# Patient Record
Sex: Female | Born: 1965 | Hispanic: Yes | Marital: Single | Smoking: Never smoker
Health system: Southern US, Community
[De-identification: ages and names within clinical notes are randomized; demographics above are authoritative.]

## PROBLEM LIST (undated history)

## (undated) DIAGNOSIS — E669 Obesity, unspecified: Secondary | ICD-10-CM

## (undated) DIAGNOSIS — I10 Essential (primary) hypertension: Secondary | ICD-10-CM

## (undated) HISTORY — PX: TONSILLECTOMY: SUR1361

## (undated) HISTORY — DX: Obesity, unspecified: E66.9

---

## 2005-11-13 ENCOUNTER — Ambulatory Visit: Payer: Self-pay | Admitting: Family Medicine

## 2005-11-14 ENCOUNTER — Ambulatory Visit (HOSPITAL_COMMUNITY): Admission: RE | Admit: 2005-11-14 | Discharge: 2005-11-14 | Payer: Self-pay | Admitting: Sports Medicine

## 2014-09-29 ENCOUNTER — Emergency Department (HOSPITAL_COMMUNITY): Payer: Medicaid Other

## 2014-09-29 ENCOUNTER — Emergency Department (HOSPITAL_COMMUNITY)
Admission: EM | Admit: 2014-09-29 | Discharge: 2014-09-29 | Disposition: A | Discharge status: Home / Self Care | Payer: Medicaid Other | Attending: Emergency Medicine | Admitting: Emergency Medicine

## 2014-09-29 ENCOUNTER — Encounter (HOSPITAL_COMMUNITY): Payer: Self-pay

## 2014-09-29 DIAGNOSIS — R112 Nausea with vomiting, unspecified: Secondary | ICD-10-CM | POA: Diagnosis not present

## 2014-09-29 DIAGNOSIS — M545 Low back pain: Secondary | ICD-10-CM | POA: Diagnosis not present

## 2014-09-29 DIAGNOSIS — R42 Dizziness and giddiness: Secondary | ICD-10-CM | POA: Insufficient documentation

## 2014-09-29 DIAGNOSIS — Z3202 Encounter for pregnancy test, result negative: Secondary | ICD-10-CM | POA: Insufficient documentation

## 2014-09-29 DIAGNOSIS — R51 Headache: Secondary | ICD-10-CM | POA: Insufficient documentation

## 2014-09-29 LAB — URINALYSIS, ROUTINE W REFLEX MICROSCOPIC
BILIRUBIN URINE: NEGATIVE
Glucose, UA: NEGATIVE mg/dL
Hgb urine dipstick: NEGATIVE
KETONES UR: NEGATIVE mg/dL
NITRITE: NEGATIVE
PROTEIN: NEGATIVE mg/dL
Specific Gravity, Urine: 1.023 (ref 1.005–1.030)
UROBILINOGEN UA: 0.2 mg/dL (ref 0.0–1.0)
pH: 5.5 (ref 5.0–8.0)

## 2014-09-29 LAB — CBC
HEMATOCRIT: 43 % (ref 36.0–46.0)
Hemoglobin: 13.9 g/dL (ref 12.0–15.0)
MCH: 29.4 pg (ref 26.0–34.0)
MCHC: 32.3 g/dL (ref 30.0–36.0)
MCV: 91.1 fL (ref 78.0–100.0)
Platelets: 264 10*3/uL (ref 150–400)
RBC: 4.72 MIL/uL (ref 3.87–5.11)
RDW: 12.4 % (ref 11.5–15.5)
WBC: 7.3 10*3/uL (ref 4.0–10.5)

## 2014-09-29 LAB — BASIC METABOLIC PANEL
Anion gap: 8 (ref 5–15)
BUN: 22 mg/dL (ref 6–23)
CHLORIDE: 104 meq/L (ref 96–112)
CO2: 31 mmol/L (ref 19–32)
Calcium: 9.4 mg/dL (ref 8.4–10.5)
Creatinine, Ser: 0.7 mg/dL (ref 0.50–1.10)
GLUCOSE: 104 mg/dL — AB (ref 70–99)
POTASSIUM: 4 mmol/L (ref 3.5–5.1)
SODIUM: 143 mmol/L (ref 135–145)

## 2014-09-29 LAB — URINE MICROSCOPIC-ADD ON

## 2014-09-29 LAB — I-STAT TROPONIN, ED: Troponin i, poc: 0 ng/mL (ref 0.00–0.08)

## 2014-09-29 LAB — POC URINE PREG, ED: Preg Test, Ur: NEGATIVE

## 2014-09-29 MED ORDER — MECLIZINE HCL 25 MG PO TABS: 25.0000 mg | ORAL_TABLET | Freq: Two times a day (BID) | ORAL | Status: DC | PRN

## 2014-09-29 MED ORDER — MECLIZINE HCL 25 MG PO TABS
25.0000 mg | ORAL_TABLET | Freq: Once | ORAL | Status: AC
  Administered 2014-09-29: 25 mg via ORAL
  Filled 2014-09-29: qty 1
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
## 2014-09-29 NOTE — Discharge Instructions (Signed)
   Vrtigo (Vertigo)  Vrtigo es la sensacin de que se est moviendo estando quieto. Puede ser peligroso si ocurre cuando est trabajado, conduciendo vehculos o realizando actividades difciles.  CAUSAS  El vrtigo se produce cuando hay un conflicto en las seales que se envan al cerebro desde los sistemas visual y sensorial del cuerpo. Hay numerosas causas que originan este problema, entre las que se incluyen:   Infecciones, especialmente en el odo interno.  Una mala reaccin a un medicamento o mal uso de alcohol y frmacos.  Abstinencia de drogas o alcohol.  Cambios rpidos de posicin, como al acostarse o darse vuelta en la cama.  Dolor de cabeza migraoso.  Disminucin del flujo sanguneo hacia el cerebro.  Aumento de la presin en el cerebro por un traumatismo, infeccin, tumor o sangrado en la cabeza. SNTOMAS  Puede sentir como si el mundo da vueltas o va a caer al piso. Como hay problemas en el equilibrio, el vrtigo puede causar nuseas y vmitos. Tiene movimientos oculares involuntarios (nistagmus).  DIAGNSTICO  El vrtigo normalmente se diagnostica con un examen fsico. Si la causa no se conoce, el mdico puede indicar diagnstico por imgenes, como una resonancia magntica (imgenes por resonancia magntica).  TRATAMIENTO  La mayor parte de los casos de vrtigo se resuelve sin tratamiento. Segn la causa, el mdico podr recetar ciertos medicamentos. Si se relaciona con la posicin del cuerpo, podr recomendarle movimientos o procedimientos para corregir el problema. En algunos casos raros, si la causa del vrtigo es un problema en el odo interno, necesitar una ciruga.  INSTRUCCIONES PARA EL CUIDADO DOMICILIARIO  Siga las indicaciones del mdico.  Evite conducir vehculos.  Evite operar maquinarias pesadas.  Evite realizar tareas que seran peligrosas para usted u otras personas durante un episodio de vrtigo.  Comunquele al mdico si nota que ciertos  medicamentos parecen asociarse con las crisis. Algunos medicamentos que se usan para tratar los episodios, en algunas personas los empeoran. SOLICITE ATENCIN MDICA DE INMEDIATO SI:  Los medicamentos no alivian las crisis o hacen que estas empeoren.  Tiene dificultad para hablar, caminar, siente debilidad o tiene problemas para usar los brazos, las manos o las piernas.  Comienza a sufrir un dolor de cabeza intenso.  Las nuseas y los vmitos no se alivian o se agravan.  Aparecen trastornos visuales.  Un miembro de su familia nota cambios en su conducta.  Hay alguna modificacin en su trastorno que parece hacerlo empeorar en lugar de mejorar. ASEGRESE DE QUE:   Comprende estas instrucciones.  Controlar su enfermedad.  Solicitar ayuda de inmediato si no mejora o si empeora. Document Released: 06/21/2005 Document Revised: 12/04/2011 ExitCare Patient Information 2015 [employer]. This information is not intended to replace advice given to you by your health care provider. Make sure you discuss any questions you have with your health care provider.  

## 2014-09-29 NOTE — Progress Notes (Signed)
[facility] Specialist  [facility] @ 10:45am, pt verbalized understanding of this appointment. My contact information provided for any future questions or concerns. Interpreter phones used.

## 2014-09-29 NOTE — ED Notes (Signed)
Pt returned.
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
## 2014-10-05 ENCOUNTER — Encounter: Payer: Self-pay | Admitting: Neurology

## 2014-10-05 ENCOUNTER — Ambulatory Visit (INDEPENDENT_AMBULATORY_CARE_PROVIDER_SITE_OTHER): Payer: Medicaid Other | Admitting: Neurology

## 2014-10-05 VITALS — BP 130/91 | HR 67 | Ht 60.0 in | Wt 143.2 lb

## 2014-10-05 DIAGNOSIS — S161XXA Strain of muscle, fascia and tendon at neck level, initial encounter: Secondary | ICD-10-CM | POA: Insufficient documentation

## 2014-10-05 DIAGNOSIS — G4486 Cervicogenic headache: Secondary | ICD-10-CM | POA: Insufficient documentation

## 2014-10-05 DIAGNOSIS — R51 Headache: Secondary | ICD-10-CM

## 2014-10-05 MED ORDER — METHOCARBAMOL 500 MG PO TABS: 500.0000 mg | ORAL_TABLET | Freq: Three times a day (TID) | ORAL | Status: DC
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
## 2014-10-05 NOTE — Patient Instructions (Signed)
Distensión cervical °(Cervical Sprain) °Una distensión cervical es una lesión en el cuello, en la que los tejidos fuertes y fibrosos (ligamentos) que unen los huesos del cuello, se distienden o se rompen. Una distensión cervical puede ser desde muy leve a muy grave. En los casos graves pueden hacer que las vértebras del cuello se vuelvan inestables. Esto puede causar un daño en la médula espinal y puede dar lugar a graves problemas del sistema nervioso. La cantidad de tiempo que demora la mejoría de una distensión cervical depende de la causa y de la extensión de la lesión. La mayoría de las veces se cura en 1 a 3 semanas. °CAUSAS  °Las distensiones graves pueden ser causadas por:  °· Lesiones por deportes de contacto (como en el fútbol americano, rugby, lucha, hockey, automovilismo, gimnasia, buceo, artes marciales y boxeo). °· Colisiones en vehículos de motor. °· Lesiones de latigazo cervical. Esta es una lesión por movimiento brusco de adelante hacia atrás de la cabeza y el cuello. °· Caídas. °La causa de las distensiones cervicales leves pueden ser:  °· Adoptar posiciones incómodas, como sostener el teléfono entre la oreja y el hombro. °· Sentarse en una silla que no ofrece el soporte adecuado. °· Trabajar en una mesa de computadora mal diseñada. °· Las actividades que requieren mirar hacia arriba o hacia abajo durante largos períodos. °SÍNTOMAS  °· Dolor, sensibilidad, rigidez, o sensación de ardor en la parte anterior, posterior o lateral del cuello. Este malestar puede aparecer inmediatamente después de la lesión o puede desarrollarse lentamente y no empezar hasta 24 horas o más después de la lesión. °· Dolor o sensibilidad que se siente directamente en la parte media posterior del cuello. °· Dolor en el hombro o la zona superior de la espalda. °· Capacidad limitada para mover el cuello. °· Dolor de cabeza. °· Mareos. °· Debilidad, entumecimiento u hormigueo en las manos o los brazos. °· Espasmos  musculares. °· Dificultades para tragar o comer. °· Sensibilidad e hinchazón en el cuello. °DIAGNÓSTICO  °La mayoría de las veces, el médico puede diagnosticar este problema mediante la historia clínica y un examen físico. Su médico le preguntará acerca de lesiones previas y problemas conocidos como artritis en el cuello. Podrán tomarle radiografías para determinar si hay otros problemas, como enfermedades en los huesos del cuello. También puede ser necesario realizar otras pruebas, como tomografías computadas o resonancia magnética.  °TRATAMIENTO  °El tratamiento depende de la gravedad de la distensión. Las distensiones leves se pueden tratar con reposo, manteniendo el cuello en su lugar (inmovilización) y usando medicamentos para el dolor. Las distensiones graves deben ser inmediatamente inmovilizadas. Será necesario completar el tratamiento para aliviar el dolor, los espasmos musculares y otros síntomas, y puede incluir. °· Medicamentos como calmantes para el dolor, anestésicos o relajantes musculares. °· Fisioterapia. Esto puede incluir ejercicios de elongación, fortalecimiento y entrenamiento de la postura. Los ejercicios y una mejor postura pueden ayudar a estabilizar el cuello, fortalecer los músculos y evitar que los síntomas vuelvan a aparecer. °INSTRUCCIONES PARA EL CUIDADO EN EL HOGAR  °· Aplique hielo sobre la zona lesionada. °¨ Ponga el hielo en una bolsa plástica. °¨ Colóquese una toalla entre la piel y la bolsa de hielo. °¨ Deje el hielo durante 15 - 20 minutos y aplíquelo 3 - 4 veces por día. °· Si la lesión fue grave, le indicarán el uso de un collarín cervical. El collarín cervical es un collar de dos piezas para impedir que el cuello se mueva mientras se cura. °¨   Nose quite el collarín excepto que se lo indique su médico. °¨ Si tiene el cabello largo, manténgalo fuera del collarín. °¨ Consulte a su médico antes de hacerle ajustes. Los ajustes menores pueden ser requeridos con el tiempo para  mejorar el confort y reducir la presión sobre la barbilla o en la parte posterior de la cabeza. °¨ Si le permiten quitarse el collarín para lavarlo o darse un baño, siga las indicaciones de su médico acerca de cómo hacerlo con seguridad. °¨ Mantenga el collarín limpio pasando un paño con agua y jabón y secándolo bien. Si el collarín tiene almohadillas removibles, quítelas cada 1-2 días para lavarlas a mano con agua y jabón. Deje que se sequen al aire. Debe secarlas bien antes de volver a colocarlas en el collarín. °¨ Si le permiten quitarse el collarín para lavarlo y darse un baño, lave y seque la piel del cuello. Controle su piel para detectar irritación o llagas. Si las tiene, informe a su médico. °¨ No conduzca vehículos mientras usa el collarín. °· Sólo tome medicamentos de venta libre o recetados para calmar el dolor, el malestar o bajar la fiebre, según las indicaciones de su médico. °· Cumpla con todas las visitas de control, según le indique su médico. °· Cumpla con todas las sesiones de fisioterapia, según le indique su médico. °· Haga los ajustes necesarios en su lugar de trabajo para favorecer una buena postura. °· Evite las posiciones y actividades que empeoran los síntomas. °· Haga precalentamiento y elongue antes de comenzar una actividad para evitar problemas. °SOLICITE ATENCIÓN MÉDICA SI:  °· El dolor no se alivia con los medicamentos. °· No puede disminuir la dosis de analgésicos según lo planificado. °· Su nivel de actividad no mejora según lo esperado. °SOLICITE ATENCIÓN MÉDICA DE INMEDIATO SI:  °· Presenta cualquier hemorragia. °· Siente malestar estomacal. °· Tiene signos de reacción alérgica a los medicamentos. °· Los síntomas empeoran. °· Le aparecen síntomas nuevos e inexplicables. °· Siente adormecimiento, hormigueo, debilidad o parálisis en alguna parte del cuerpo. °ASEGÚRESE DE QUE:  °· Comprende estas instrucciones. °· Controlará su afección. °· Recibirá ayuda de inmediato si no mejora o  si empeora. °Document Released: 12/08/2008 Document Revised: 07/02/2013 °ExitCare® Patient Information ©2015 [employer]. This information is not intended to replace advice given to you by your health care provider. Make sure you discuss any questions you have with your health care provider. ° °

## 2014-11-16 ENCOUNTER — Ambulatory Visit (INDEPENDENT_AMBULATORY_CARE_PROVIDER_SITE_OTHER): Payer: Medicaid Other | Admitting: Adult Health

## 2014-11-16 ENCOUNTER — Encounter: Payer: Self-pay | Admitting: Adult Health

## 2014-11-16 VITALS — BP 125/89 | HR 78 | Ht 60.0 in | Wt 144.6 lb

## 2014-11-16 DIAGNOSIS — M542 Cervicalgia: Secondary | ICD-10-CM

## 2014-11-16 DIAGNOSIS — R51 Headache: Secondary | ICD-10-CM

## 2014-11-16 DIAGNOSIS — G4486 Cervicogenic headache: Secondary | ICD-10-CM

## 2014-11-16 DIAGNOSIS — R200 Anesthesia of skin: Secondary | ICD-10-CM

## 2014-11-16 NOTE — Progress Notes (Signed)
I reviewed note and agree with plan.   Suanne MarkerVIKRAM R. PENUMALLI, MD 11/16/2014, 4:52 PM Certified in Neurology, Neurophysiology and Neuroimaging  [facility] [address], [address] [place], [place] [id number] [phone]
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
## 2015-01-12 ENCOUNTER — Encounter: Payer: Self-pay | Admitting: Adult Health

## 2015-01-15 ENCOUNTER — Ambulatory Visit: Payer: Self-pay | Admitting: Adult Health

## 2015-01-15 ENCOUNTER — Ambulatory Visit: Payer: Medicaid Other | Admitting: Adult Health

## 2015-02-25 ENCOUNTER — Ambulatory Visit: Payer: Medicaid Other | Attending: Neurology | Admitting: Rehabilitative and Restorative Service Providers"

## 2015-02-25 DIAGNOSIS — S161XXS Strain of muscle, fascia and tendon at neck level, sequela: Secondary | ICD-10-CM | POA: Insufficient documentation

## 2015-02-25 DIAGNOSIS — X58XXXS Exposure to other specified factors, sequela: Secondary | ICD-10-CM | POA: Insufficient documentation

## 2015-02-25 DIAGNOSIS — R42 Dizziness and giddiness: Secondary | ICD-10-CM | POA: Insufficient documentation

## 2015-02-25 DIAGNOSIS — S060X0S Concussion without loss of consciousness, sequela: Secondary | ICD-10-CM

## 2015-02-25 DIAGNOSIS — R51 Headache: Secondary | ICD-10-CM | POA: Insufficient documentation

## 2015-02-25 DIAGNOSIS — S161XXD Strain of muscle, fascia and tendon at neck level, subsequent encounter: Secondary | ICD-10-CM

## 2015-02-25 DIAGNOSIS — G4486 Cervicogenic headache: Secondary | ICD-10-CM
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
## 2015-02-25 NOTE — Patient Instructions (Addendum)
   Thoracic Self-Mobilization (Supine)   Con una toalla enrollada paralela a la columna en a nivel costillas inferiores, acustese sobre la toalla con los brazos extendidos a los lados. Sostenga __2__ minutos. Descanse. Repita __1__ veces por rutina. Realice __1__ Carlis Stablerutinas por sesin. Realice __2  sesiones por da.  http://orth.exer.us/1001   Copyright  VHI. All rights reserved.    Flexors, Supine   Lie on back, head on small, rolled towel. Nod head, tipping chin down.  Hold _2 minutos. Repeat _1__ times per session. Do _2__ sessions per day.  Copyright  VHI. All rights reserved.  Sit to Side-Lying   Sit on edge of bed. Look to the right and lie down onto left side. Hold until dizziness stops + 20 seconds.  Return to sitting.  Repeat to the other side.  Repeat sequence _5___ times per session. Do __2__ sessions per day.  Copyright  VHI. All rights reserved.  Rolling   With pillow under head, start on back. Roll slowly to right. Hold position until symptoms subside. Roll slowly onto left side. Hold position until symptoms subside. Repeat sequence __5__ times per session. Do _2___ sessions per day.  Copyright  VHI. All rights reserved.

## 2015-03-03 ENCOUNTER — Other Ambulatory Visit: Payer: Self-pay | Admitting: Neurology

## 2015-03-03 DIAGNOSIS — S060X0S Concussion without loss of consciousness, sequela: Secondary | ICD-10-CM

## 2015-04-13 ENCOUNTER — Telehealth: Payer: Self-pay | Admitting: Rehabilitative and Restorative Service Providers"

## 2015-04-13 NOTE — Telephone Encounter (Signed)
PT left message with patient home # with request to call back to clinic.  Purpose of call is to let patient know that medicaid denied request for PT visits and to determine if she wishes to continue with therapy or not attend.

## 2015-04-23 ENCOUNTER — Encounter: Payer: Self-pay | Admitting: Rehabilitative and Restorative Service Providers"
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
## 2015-05-14 ENCOUNTER — Ambulatory Visit: Payer: Medicaid Other | Admitting: Adult Health

## 2015-05-20 ENCOUNTER — Ambulatory Visit (INDEPENDENT_AMBULATORY_CARE_PROVIDER_SITE_OTHER): Payer: Self-pay | Admitting: Adult Health

## 2015-05-20 ENCOUNTER — Encounter: Payer: Self-pay | Admitting: Adult Health

## 2015-05-20 VITALS — BP 121/82 | HR 69 | Ht 60.0 in | Wt 149.0 lb

## 2015-05-20 DIAGNOSIS — M542 Cervicalgia: Secondary | ICD-10-CM

## 2015-05-20 DIAGNOSIS — R42 Dizziness and giddiness: Secondary | ICD-10-CM
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
## 2015-05-20 NOTE — Patient Instructions (Signed)
Glad that you are doing so well!  If your symptoms return please let us know.

## 2015-05-29 NOTE — Progress Notes (Signed)
I agree with the assessment and plan. Breandan People A. Tailey Top, MD, PhD Certified in Neurology, Clinical Neurophysiology, Sleep Medicine, Pain Medicine and Neuroimaging  [facility] [address], [address] [place], [place] [address] [phone]   

## 2015-06-22 DIAGNOSIS — Z0289 Encounter for other administrative examinations: Secondary | ICD-10-CM

## 2015-10-31 ENCOUNTER — Emergency Department (HOSPITAL_COMMUNITY): Payer: Medicaid Other

## 2015-10-31 ENCOUNTER — Emergency Department (HOSPITAL_COMMUNITY)
Admission: EM | Admit: 2015-10-31 | Discharge: 2015-10-31 | Disposition: A | Discharge status: Home / Self Care | Payer: Medicaid Other | Attending: Emergency Medicine | Admitting: Emergency Medicine

## 2015-10-31 ENCOUNTER — Encounter (HOSPITAL_COMMUNITY): Payer: Self-pay | Admitting: Nurse Practitioner

## 2015-10-31 DIAGNOSIS — Z3202 Encounter for pregnancy test, result negative: Secondary | ICD-10-CM | POA: Insufficient documentation

## 2015-10-31 DIAGNOSIS — R091 Pleurisy: Secondary | ICD-10-CM | POA: Insufficient documentation

## 2015-10-31 DIAGNOSIS — Z79899 Other long term (current) drug therapy: Secondary | ICD-10-CM | POA: Insufficient documentation

## 2015-10-31 DIAGNOSIS — E669 Obesity, unspecified: Secondary | ICD-10-CM | POA: Insufficient documentation

## 2015-10-31 DIAGNOSIS — R05 Cough: Secondary | ICD-10-CM | POA: Insufficient documentation

## 2015-10-31 DIAGNOSIS — I10 Essential (primary) hypertension: Secondary | ICD-10-CM | POA: Insufficient documentation

## 2015-10-31 DIAGNOSIS — R1013 Epigastric pain: Secondary | ICD-10-CM | POA: Insufficient documentation

## 2015-10-31 HISTORY — DX: Essential (primary) hypertension: I10

## 2015-10-31 LAB — BASIC METABOLIC PANEL
Anion gap: 10 (ref 5–15)
BUN: 20 mg/dL (ref 6–20)
CO2: 26 mmol/L (ref 22–32)
CREATININE: 0.8 mg/dL (ref 0.44–1.00)
Calcium: 9.7 mg/dL (ref 8.9–10.3)
Chloride: 104 mmol/L (ref 101–111)
GFR calc Af Amer: >60 mL/min (ref >60)
GLUCOSE: 102 mg/dL — AB (ref 65–99)
POTASSIUM: 4.2 mmol/L (ref 3.5–5.1)
SODIUM: 140 mmol/L (ref 135–145)

## 2015-10-31 LAB — CBC
HCT: 41.4 % (ref 36.0–46.0)
HEMOGLOBIN: 13.9 g/dL (ref 12.0–15.0)
MCH: 30.3 pg (ref 26.0–34.0)
MCHC: 33.6 g/dL (ref 30.0–36.0)
MCV: 90.2 fL (ref 78.0–100.0)
Platelets: 275 10*3/uL (ref 150–400)
RBC: 4.59 MIL/uL (ref 3.87–5.11)
RDW: 12.4 % (ref 11.5–15.5)
WBC: 7.9 10*3/uL (ref 4.0–10.5)

## 2015-10-31 LAB — I-STAT BETA HCG BLOOD, ED (MC, WL, AP ONLY)

## 2015-10-31 LAB — HEPATIC FUNCTION PANEL
ALBUMIN: 4.1 g/dL (ref 3.5–5.0)
ALK PHOS: 65 U/L (ref 38–126)
ALT: 33 U/L (ref 14–54)
AST: 29 U/L (ref 15–41)
BILIRUBIN TOTAL: 0.8 mg/dL (ref 0.3–1.2)
Total Protein: 7.8 g/dL (ref 6.5–8.1)

## 2015-10-31 LAB — LIPASE, BLOOD: Lipase: 26 U/L (ref 11–51)

## 2015-10-31 LAB — I-STAT TROPONIN, ED: TROPONIN I, POC: 0 ng/mL (ref 0.00–0.08)

## 2015-10-31 LAB — D-DIMER, QUANTITATIVE: D-Dimer, Quant: <0.27 ug/mL-FEU (ref 0.00–0.50)

## 2015-10-31 MED ORDER — NAPROXEN 500 MG PO TABS: 500.0000 mg | ORAL_TABLET | Freq: Two times a day (BID) | ORAL | Status: AC

## 2015-10-31 MED ORDER — TRAMADOL HCL 50 MG PO TABS: 100.0000 mg | ORAL_TABLET | Freq: Four times a day (QID) | ORAL | Status: AC | PRN

## 2015-10-31 MED ORDER — TRAMADOL HCL 50 MG PO TABS
100.0000 mg | ORAL_TABLET | Freq: Once | ORAL | Status: AC
  Administered 2015-10-31: 100 mg via ORAL
  Filled 2015-10-31: qty 2

## 2015-10-31 MED ORDER — KETOROLAC TROMETHAMINE 60 MG/2ML IM SOLN
60.0000 mg | Freq: Once | INTRAMUSCULAR | Status: AC
  Administered 2015-10-31: 60 mg via INTRAMUSCULAR
  Filled 2015-10-31: qty 2

## 2015-10-31 NOTE — ED Provider Notes (Signed)
CSN: [id number]     Arrival date & time 10/31/15  1411 History   First MD Initiated Contact with Patient 10/31/15 1703     Chief Complaint  Patient presents with  . Chest Pain     (Consider location/radiation/quality/duration/timing/severity/associated sxs/prior Treatment) HPI Patient reports she had a frequent dry cough several weeks ago. She reports she did not have fever or sputum production association with this. He is coughing at times she will get burning in the epigastrium and had an episode of vomiting. She reports about 1 week ago she developed pain in the right side of her rib area. She was taking ibuprofen and thought it was a muscle strain. It has not been improving and has actually gotten worse. Worse with movement and deep breath. She has not had any further vomiting. No calf pain or lower extremity swelling. Past Medical History  Diagnosis Date  . Obesity   . Hypertension    Past Surgical History  Procedure Laterality Date  . Tonsillectomy     Family History  Problem Relation Age of Onset  . Healthy Mother   . Healthy Father   . Healthy Brother    Social History  Substance Use Topics  . Smoking status: Never Smoker   . Smokeless tobacco: Never Used  . Alcohol Use: No   OB History    No data available     Review of Systems  10 Systems reviewed and are negative for acute change except as noted in the HPI.   Allergies  Review of patient's allergies indicates no known allergies.  Home Medications   Prior to Admission medications   Medication Sig Start Date End Date Taking? Authorizing Provider  Cholecalciferol (VITAMIN D PO) Take 1 tablet by mouth daily.   Yes Historical Provider, MD  ibuprofen (ADVIL,MOTRIN) 200 MG tablet Take 200 mg by mouth every 6 (six) hours as needed (pain).   Yes Historical Provider, MD  naproxen (NAPROSYN) 500 MG tablet Take 1 tablet (500 mg total) by mouth 2 (two) times daily. 10/31/15   Arby Barrette, MD  traMADol (ULTRAM) 50 MG  tablet Take 2 tablets (100 mg total) by mouth every 6 (six) hours as needed. 10/31/15   Arby Barrette, MD   BP 127/87 mmHg  Pulse 75  Temp(Src) 97.9 F (36.6 C) (Oral)  Resp 14  Wt 155 lb 4.8 oz (70.444 kg)  SpO2 97% Physical Exam  Constitutional: She is oriented to person, place, and time. She appears well-developed and well-nourished.  HENT:  Head: Normocephalic and atraumatic.  Eyes: EOM are normal. Pupils are equal, round, and reactive to light.  Neck: Neck supple.  Cardiovascular: Normal rate, regular rhythm, normal heart sounds and intact distal pulses.   Pulmonary/Chest: Effort normal and breath sounds normal. She exhibits tenderness.  Right chest wall very tender to palpation from the posterior axillary line to the anterior chest from approximately ribs 5 through 12. No rashes or crepitus.  Abdominal: Soft. Bowel sounds are normal. She exhibits no distension. There is no tenderness.  Musculoskeletal: Normal range of motion. She exhibits no edema or tenderness.  Neurological: She is alert and oriented to person, place, and time. She has normal strength. Coordination normal. GCS eye subscore is 4. GCS verbal subscore is 5. GCS motor subscore is 6.  Skin: Skin is warm, dry and intact.  Psychiatric: She has a normal mood and affect.    ED Course  Procedures (including critical care time) Labs Review Labs Reviewed  BASIC METABOLIC  PANEL - Abnormal; Notable for the following:    Glucose, Bld 102 (*)    All other components within normal limits  HEPATIC FUNCTION PANEL - Abnormal; Notable for the following:    Bilirubin, Direct <0.1 (*)    All other components within normal limits  CBC  D-DIMER, QUANTITATIVE (NOT AT [facility])  LIPASE, BLOOD  I-STAT TROPOININ, ED  I-STAT BETA HCG BLOOD, ED ([facility], [facility], [facility] ONLY)    Imaging Review Dg Chest 2 View  10/31/2015  CLINICAL DATA:  Right rib pain radiating to back x1 week EXAM: CHEST  2 VIEW COMPARISON:  09/29/2014 FINDINGS: Lungs are clear.   No pleural effusion or pneumothorax. The heart is normal in size. Visualized osseous structures are within normal limits. IMPRESSION: Normal chest radiographs. Electronically Signed   By: Charline Bills M.D.   On: 10/31/2015 16:04   I have personally reviewed and evaluated these images and lab results as part of my medical decision-making.   EKG Interpretation   Date/Time:  Sunday October 31 2015 15:40:22 EST Ventricular Rate:  87 PR Interval:  156 QRS Duration: 76 QT Interval:  366 QTC Calculation: 440 R Axis:   24 Text Interpretation:  Normal sinus rhythm Normal ECG agree. no sig change.  Confirmed by Donnald Garre, MD, Lebron Conners [phone]) on 10/31/2015 5:05:14 PM      MDM   Final diagnoses:  Pleurisy   At this time findings most consistent with pleurisy area patient had cough likely viral in nature some 2-3 weeks ago. This is resolved and there are no signs of pneumonia. D-dimer is not elevated and patient does not have lower extremity swelling or calf pain. Pain is reproducible on the chest wall. LFTs are normal. HOH redo with naproxen and tramadol for pain control with recommendations for follow-up in return precautions provided.    Arby Barrette, MD 10/31/15 [phone]

## 2015-10-31 NOTE — Discharge Instructions (Signed)
Pleuresa (Pleurisy) La pleuresa es la inflamacin y Water quality scientist de los tejidos que recubren los pulmones (pleura). Debido a esta inflamacin, se siente dolor al respirar. Este puede agravarse al toser, rer o al respirar profundo. La causa de la pleuresa generalmente es una enfermedad o infeccin subyacente.  INSTRUCCIONES PARA EL CUIDADO EN EL HOGAR  Controle su afeccin para ver si hay cambios. Las siguientes acciones ayudarn a Architectural technologist que pueda presentar:  Los medicamentos pueden Engineer, materials. Slo tome medicamentos de venta libre o recetados para Primary school teacher, Environmental health practitioner o bajar la fiebre, segn las indicaciones de su mdico.  [employer] antibiticos tal como se le indic. Finalice la prescripcin completa, aunque se sienta mejor. SOLICITE ATENCIN MDICA SI:   No consigue Engineer, materials con la medicacin, o el dolor King.  Observa un aumento de las secreciones similares a pus (purulentas) cuando tose. SOLICITE ATENCIN MDICA DE INMEDIATO SI:   Observa una coloracin [employer], los dedos de las manos o de los pies.  Elimina sangre al toser.  Tiene una creciente dificultad para respirar.  Sufre un dolor continuo que no se alivia con los medicamentos o dura ms de [employer].  Siente un dolor que se irradia hacia el cuello, los brazos o la Watson.  Presenta problemas respiratorios en aumento o le falta el aire.  Comienza a sentir fiebre, tiene urticaria, vomita, se desmaya u otras molestias de importancia. ASEGRESE DE QUE:  Comprende estas instrucciones.   Controlar su afeccin.   Recibir ayuda de inmediato si no mejora o si empeora.    Esta informacin no tiene Theme park manager el consejo del mdico. Asegrese de hacerle al mdico cualquier pregunta que tenga.   Document Released: 06/21/2005 Document Revised: 05/14/2013 Elsevier Interactive Patient Education [employer]

## 2015-10-31 NOTE — ED Notes (Signed)
She c/o R sided rib pain x 1 week, radiating into her back. She thought it was a muscle but states she tried ibuprofen with no relief. Pain aggravated by sitting down. She denies any injury. She reports shes had a dry cough for 3 weeks. she denies SOB, fevers. Last week she had some nausea and vomiting prior to the onset of pain. N/v has resolved. She is A&Ox4, resp e/u

## 2015-12-22 IMAGING — CT CT HEAD W/O CM
2 series · 16 of 30 positions shown, 20 images · non-contrast
Comparison: None.

CLINICAL DATA: Vertigo

EXAM:
CT HEAD WITHOUT CONTRAST
TECHNIQUE: Contiguous axial images were obtained from the base of the skull
through the vertex without intravenous contrast.

[Series 201: head w/o, idose (1) · axial · non-contrast · 0.46mm/px · z∈[+107,+227]mm · 13 of 29 slices shown, 17 images]
[im 3/29  brain]
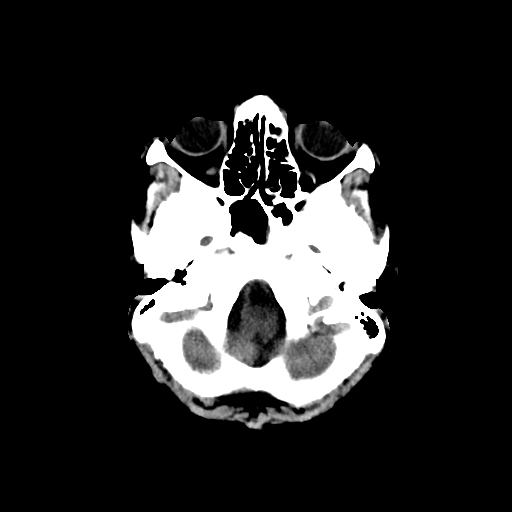
[im 3/29  bone]
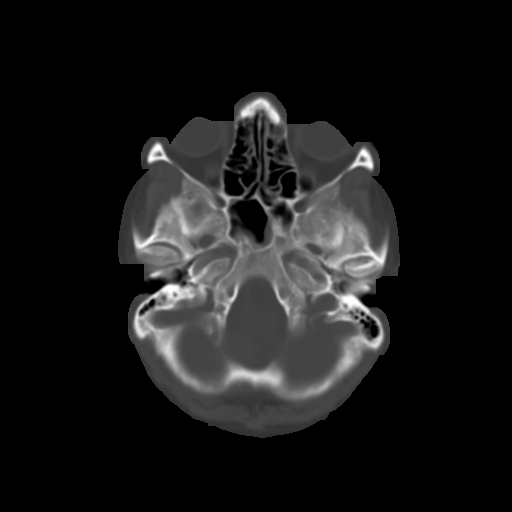
[im 5/29  brain]
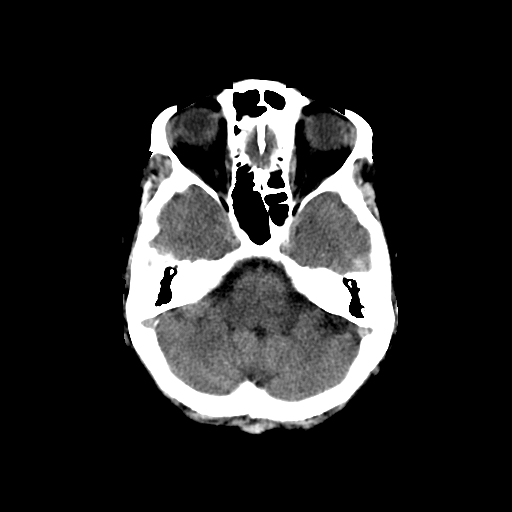
[im 7/29  brain]
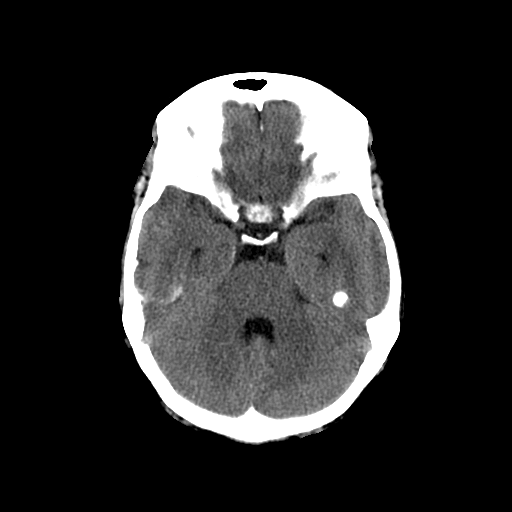
[im 9/29  brain]
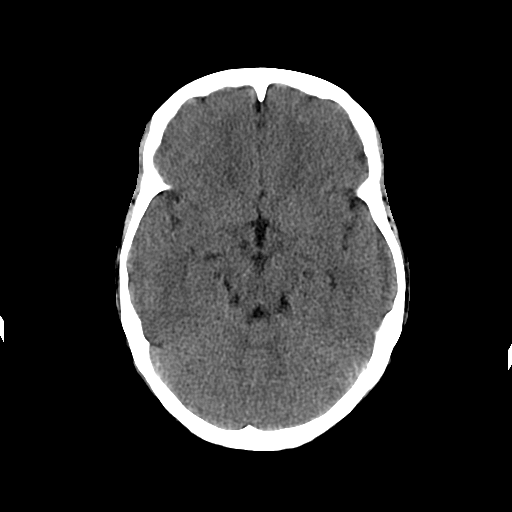
[im 11/29  brain]
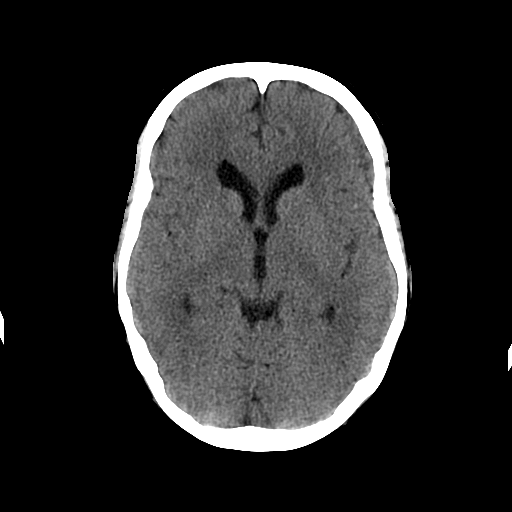
[im 11/29  bone]
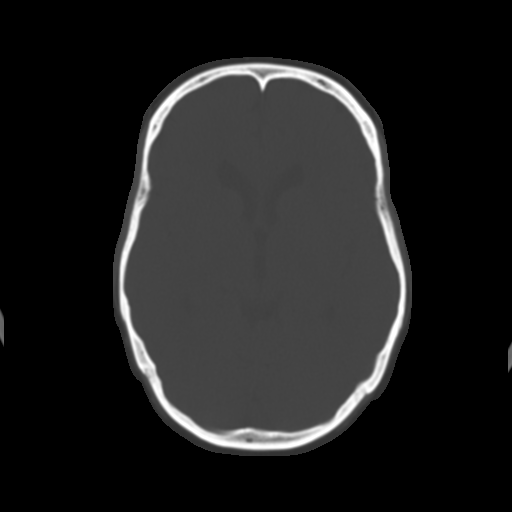
[im 13/29  brain]
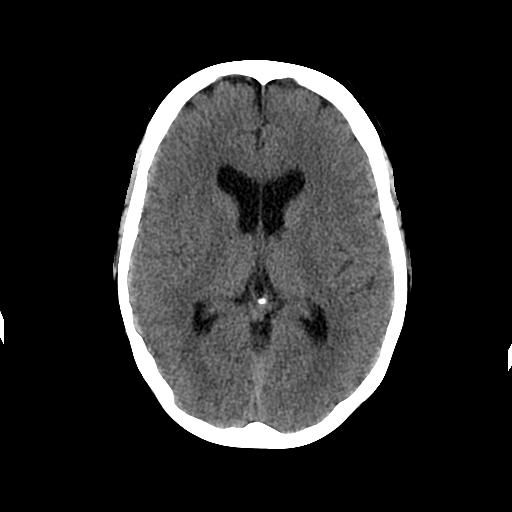
[im 15/29  brain]
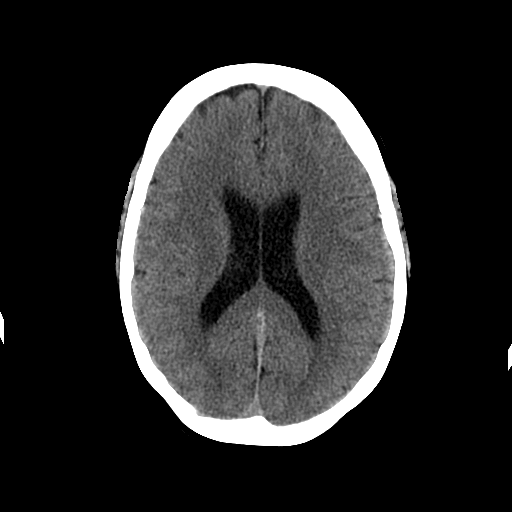
[im 17/29  brain]
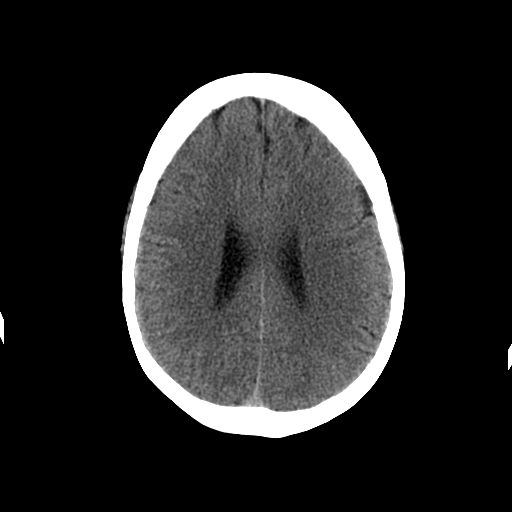
[im 19/29  brain]
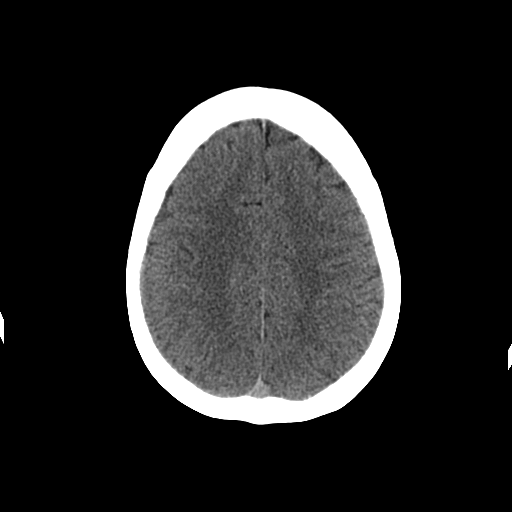
[im 19/29  bone]
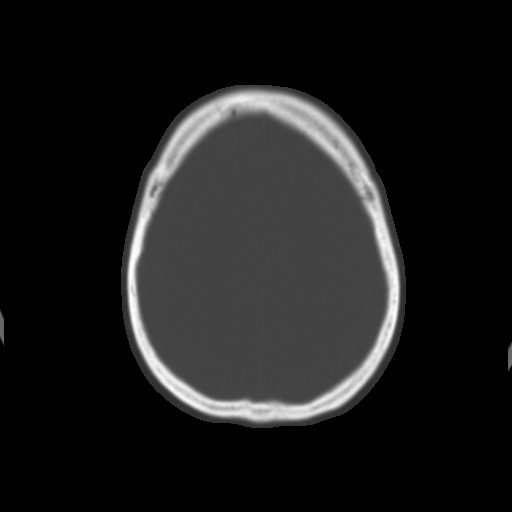
[im 21/29  brain]
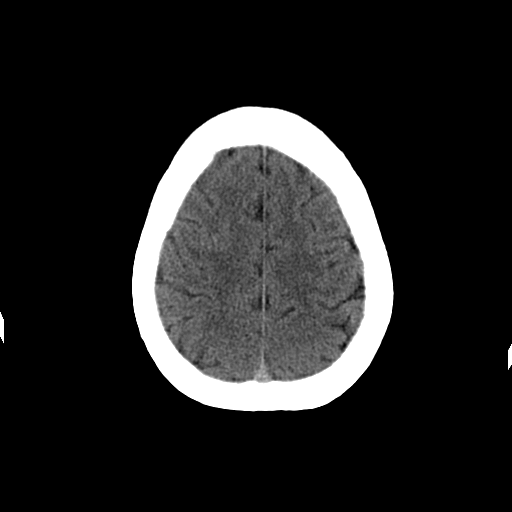
[im 23/29  brain]
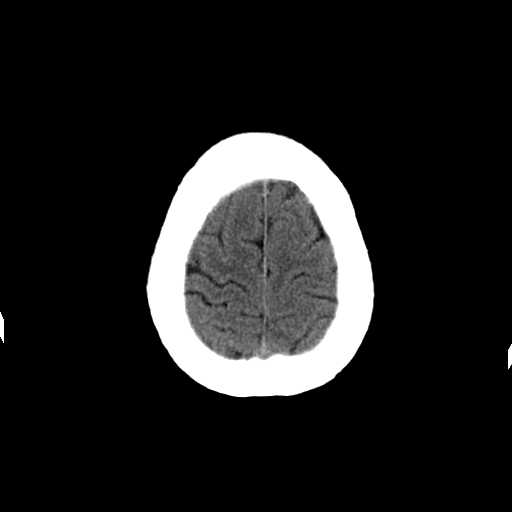
[im 25/29  brain]
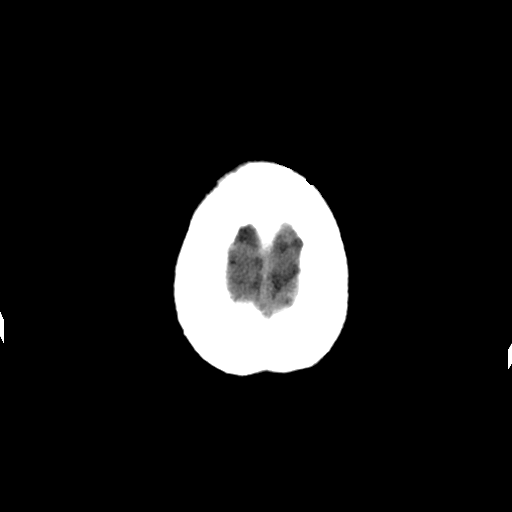
[im 27/29  brain]
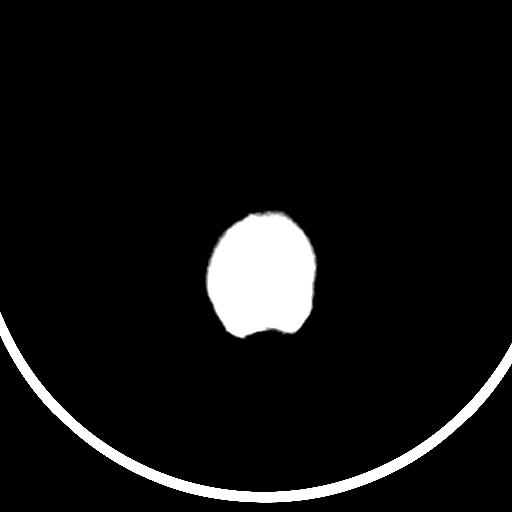
[im 27/29  bone]
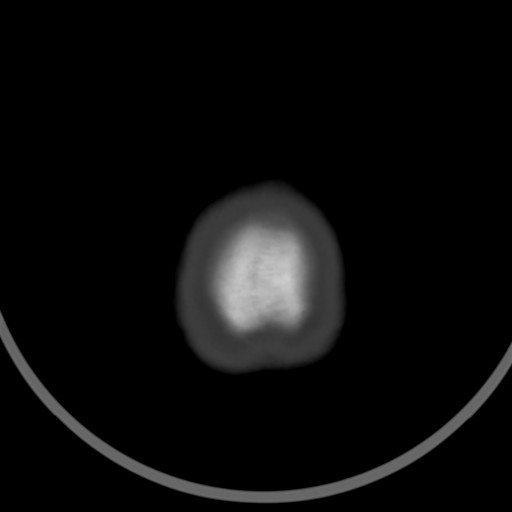

[Series 202: head w/o bone, idose (1) · axial · non-contrast · 0.46mm/px · z∈[+107,+147]mm · 3 of 29 slices shown]
[im 3/29  bone]
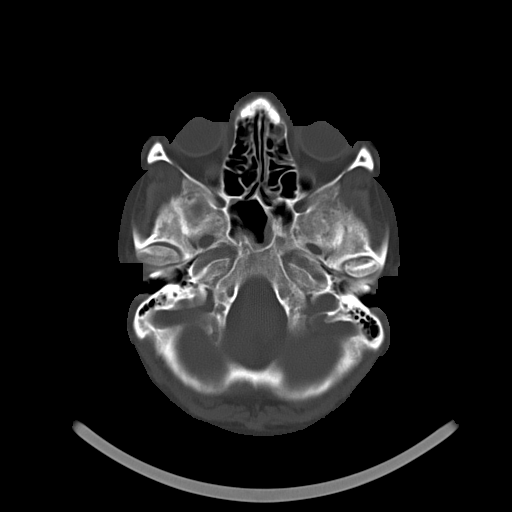
[im 7/29  bone]
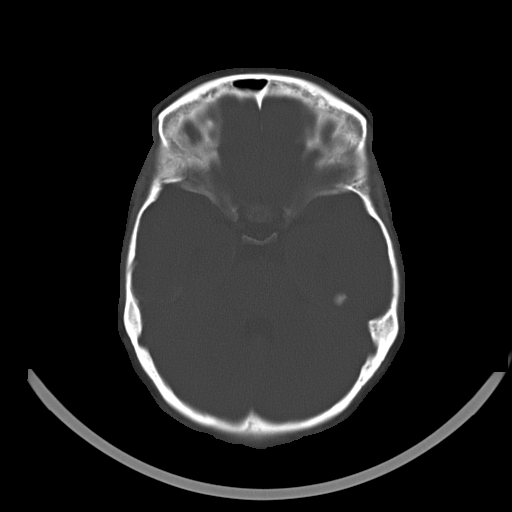
[im 11/29  bone]
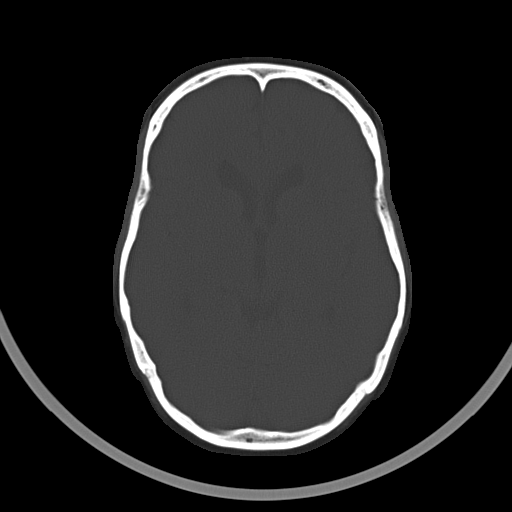

[16 of 30 positions shown; findings below may reference images not displayed]

FINDINGS: Ventricle size is normal. Negative for acute or chronic infarct.
Negative for hemorrhage or mass.

Air-fluid level left maxillary sinus. Mucosal edema in the sphenoid
sinus. No acute bony abnormality.
IMPRESSION: Normal CT brain

Air-fluid level left maxillary sinus.

## 2019-04-18 ENCOUNTER — Emergency Department (HOSPITAL_COMMUNITY): Payer: BLUE CROSS/BLUE SHIELD

## 2019-04-18 ENCOUNTER — Encounter (HOSPITAL_COMMUNITY): Payer: Self-pay | Admitting: Emergency Medicine

## 2019-04-18 ENCOUNTER — Emergency Department (HOSPITAL_COMMUNITY)
Admission: EM | Admit: 2019-04-18 | Discharge: 2019-04-18 | Disposition: A | Discharge status: Home / Self Care | Payer: BLUE CROSS/BLUE SHIELD | Attending: Emergency Medicine | Admitting: Emergency Medicine

## 2019-04-18 ENCOUNTER — Other Ambulatory Visit: Payer: Self-pay

## 2019-04-18 DIAGNOSIS — S8012XA Contusion of left lower leg, initial encounter: Secondary | ICD-10-CM | POA: Insufficient documentation

## 2019-04-18 DIAGNOSIS — Y998 Other external cause status: Secondary | ICD-10-CM | POA: Diagnosis not present

## 2019-04-18 DIAGNOSIS — S161XXA Strain of muscle, fascia and tendon at neck level, initial encounter: Secondary | ICD-10-CM | POA: Insufficient documentation

## 2019-04-18 DIAGNOSIS — Y9389 Activity, other specified: Secondary | ICD-10-CM | POA: Diagnosis not present

## 2019-04-18 DIAGNOSIS — Y92410 Unspecified street and highway as the place of occurrence of the external cause: Secondary | ICD-10-CM | POA: Insufficient documentation

## 2019-04-18 DIAGNOSIS — G44319 Acute post-traumatic headache, not intractable: Secondary | ICD-10-CM | POA: Diagnosis not present

## 2019-04-18 DIAGNOSIS — S199XXA Unspecified injury of neck, initial encounter: Secondary | ICD-10-CM | POA: Diagnosis present

## 2019-04-18 DIAGNOSIS — S39012A Strain of muscle, fascia and tendon of lower back, initial encounter: Secondary | ICD-10-CM | POA: Diagnosis not present

## 2019-04-18 DIAGNOSIS — I10 Essential (primary) hypertension: Secondary | ICD-10-CM | POA: Diagnosis not present

## 2019-04-18 LAB — I-STAT BETA HCG BLOOD, ED (MC, WL, AP ONLY): I-stat hCG, quantitative: 7.5 m[IU]/mL — ABNORMAL HIGH (ref <5)

## 2019-04-18 MED ORDER — MECLIZINE HCL 25 MG PO TABS: 25.0000 mg | ORAL_TABLET | Freq: Three times a day (TID) | ORAL | 0 refills | Status: AC | PRN

## 2019-04-18 MED ORDER — MECLIZINE HCL 25 MG PO TABS
25.0000 mg | ORAL_TABLET | Freq: Once | ORAL | Status: AC
  Administered 2019-04-18: 23:00:00 25 mg via ORAL
  Filled 2019-04-18: qty 1

## 2019-04-18 MED ORDER — IBUPROFEN 600 MG PO TABS: 600.0000 mg | ORAL_TABLET | Freq: Four times a day (QID) | ORAL | 0 refills | Status: AC | PRN

## 2019-04-18 MED ORDER — IBUPROFEN 200 MG PO TABS
600.0000 mg | ORAL_TABLET | Freq: Once | ORAL | Status: AC
Start: 2019-04-18 — End: 2019-04-18
  Administered 2019-04-18: 23:00:00 600 mg via ORAL
  Filled 2019-04-18: qty 1

## 2019-04-18 MED ORDER — METHOCARBAMOL 500 MG PO TABS
500.0000 mg | ORAL_TABLET | Freq: Once | ORAL | Status: AC
  Administered 2019-04-18: 23:00:00 500 mg via ORAL
  Filled 2019-04-18: qty 1

## 2019-04-18 MED ORDER — METHOCARBAMOL 500 MG PO TABS: 500.0000 mg | ORAL_TABLET | Freq: Two times a day (BID) | ORAL | 0 refills | Status: AC
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
